# Patient Record
Sex: Male | Born: 1995 | Race: White | Hispanic: No | Marital: Single | Smoking: Never smoker
Health system: Southern US, Community
[De-identification: ages and names within clinical notes are randomized; demographics above are authoritative.]

## PROBLEM LIST (undated history)

## (undated) HISTORY — PX: WISDOM TOOTH EXTRACTION: SHX21

---
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
## 2014-08-07 NOTE — H&P (Signed)
PATIENT NAME:  Richard Young, LENGACHER MR#:  [id number] DATE OF BIRTH:  [birth year]  DATE OF ADMISSION:  08/14/2011  PRIMARY CARE PHYSICIAN: Dr. Tammy Sours, [facility]  ADMITTING PHYSICIAN: Dr. Michela Pitcher   CHIEF COMPLAINT: Fever, seizure and abdominal pain.   BRIEF HISTORY: Richard Young is a 19 year old boy seen in the Emergency Room with a two day history of fever with recent development of abdominal pain. 48 hours ago he was generally unwell and was noted to have a temperature over 102. His mother confirmed the temperature. He was able to sleep over the course of that evening but the following morning upon awaking was noted again to have a temperature of almost 101. While voiding he suffered a witnessed seizure and was transported urgently to the Emergency Room for further evaluation. Work-up at that time revealed a white blood cell count of 13,500 with reasonable blood chemistries and no evidence of any dehydration. EKG and chest x-ray were performed both of which did not demonstrate any significant abnormality. He returned home yesterday afternoon continuing to be febrile. He slept poorly over the course of the evening and then woke up early this morning and began to develop some abdominal pain. Bilateral lower quadrant abdominal pain accompanied by seven bouts of diarrhea. He had liquid stools which he felt that he really could not control. He did not have any nausea or vomiting, was able to eat normally through lunch today but with the persistent fever, abdominal pain and diarrhea he presented to the Emergency Room once again for further evaluation. He was not febrile on his presentation to the Emergency Room. His electrolytes were still largely unremarkable, although CO2 was slightly elevated at 28. White blood cell count was now below 7000. He was afebrile on presentation with otherwise reasonable vital signs. A noncontrasted CT scan was performed. No significant abdominal abnormality could be  identified. Specifically, the appendix could not be visualized. Head CT scan was also performed for his seizure disorder which was unremarkable.   In view of his persistent abdominal pain, the surgical service was consulted for possible acute appendicitis.   He has no other significant GI history. No history of hepatitis, yellow jaundice, pancreatitis, peptic ulcer disease or abdominal surgery. He has never had any hospitalizations. He has no cardiac disease, hypertension, or diabetes. He is followed regularly by the [facility] group and Dr. Tammy Sours is his primary pediatrician.   MEDICATIONS: He takes no medications regularly.   ALLERGIES: No medical allergies.   FAMILY HISTORY: He does have family history of seizure disorder in his father which is well controlled. His mother has significant exposure to seizure disorder.  REVIEW OF SYSTEMS: Performed and is otherwise unremarkable. His diarrhea has improved.   PHYSICAL EXAMINATION:  GENERAL: He is an alert, pleasant, comfortable gentleman in no significant distress.   VITALS: His vital signs are stable and he is not febrile. Blood pressure 113/68, heart rate 102 and regular.   HEENT: Unremarkable. He is nonicteric and he has equally round pupils. He has no facial deformities and he is a bit flushed.   NECK: Supple without adenopathy and his trachea is midline.   CHEST: Clear with no adventitious sounds. He has normal pulmonary excursion.   CARDIAC: No murmurs or gallops to my ear. He seems to be in normal sinus rhythm.   ABDOMEN: His abdomen is soft with generalized lower quadrant tenderness and some point tenderness in right lower quadrant. He has no guarding and no significant rebound. He  has active bowel sounds. No masses. No hernias noted.   EXTREMITIES: He has normal lower extremity examination with full range of motion and good distal pulses.   PSYCHIATRIC/NEUROLOGIC: Unremarkable as is his current neurologic exam  with normal orientation and affect and full range of motion with no neurologic defects noted.   LABORATORY, DIAGNOSTIC AND RADIOLOGICAL DATA: I have independently reviewed his CT scan. I do not identify any inflammatory changes in the right lower quadrant. There is an air filled structure which could be the appendix but I agree with the radiologist very difficult to know for sure and the absence of p.o. contrast limits the ability to separate the loops of bowel. An unopacified bowel makes very difficult to identify any structures definitively in the right lower quadrant.   ASSESSMENT AND PLAN: His clinical presentation and clinical examination do not support acute appendicitis. Abdominal pain has been last part of his presentation and he has no other GI symptoms except diarrhea. This presentation sounds to me like viral infection although we certainly cannot be certain that he does not have some underlying appendiceal problem. Will plan to admit him to the hospital since this is his second presentation in 24 hours. Will arrange for pediatric consultation to be certain there is no further work-up required with his seizure disorder. We will not start him on antibiotics at the present time. Will give him pain medicine, rehydration and antiemetics and see how he does over the next 12 to 18 hours. Should appendicitis still be a potential diagnostic issue we can repeat a CT scan with contrast or consider laparoscopy to review the right lower quadrant anatomy. However, clinically at this time I do not think he has appendicitis and I see no urgent surgical indications. I discussed this plan with the patient and his mother and they are in agreement.   ____________________________ Carmie Endalph L. Ely III, MD rle:cms D: 08/14/2011 00:25:20 ET T: 08/14/2011 08:42:21 ET JOB#: [id number]  cc: Quentin Orealph L. Ely III, MD, <Dictator> Scott A. Fredric MareBailey, MD Quentin OreALPH L ELY MD ELECTRONICALLY SIGNED 08/15/2011 19:30

## 2017-02-17 ENCOUNTER — Other Ambulatory Visit: Payer: Self-pay

## 2017-02-17 ENCOUNTER — Encounter: Payer: Self-pay | Admitting: Emergency Medicine

## 2017-02-17 ENCOUNTER — Emergency Department: Payer: Self-pay

## 2017-02-17 ENCOUNTER — Emergency Department
Admission: EM | Admit: 2017-02-17 | Discharge: 2017-02-18 | Disposition: A | Discharge status: Home / Self Care | Payer: Self-pay | Attending: Emergency Medicine | Admitting: Emergency Medicine

## 2017-02-17 DIAGNOSIS — F419 Anxiety disorder, unspecified: Secondary | ICD-10-CM

## 2017-02-17 DIAGNOSIS — R0602 Shortness of breath: Secondary | ICD-10-CM

## 2017-02-17 LAB — CBC
HCT: 43.6 % (ref 40.0–52.0)
HEMOGLOBIN: 15.3 g/dL (ref 13.0–18.0)
MCH: 30.6 pg (ref 26.0–34.0)
MCHC: 35.1 g/dL (ref 32.0–36.0)
MCV: 87.2 fL (ref 80.0–100.0)
PLATELETS: 231 10*3/uL (ref 150–440)
RBC: 5 MIL/uL (ref 4.40–5.90)
RDW: 12 % (ref 11.5–14.5)
WBC: 7.6 10*3/uL (ref 3.8–10.6)

## 2017-02-17 LAB — BASIC METABOLIC PANEL
ANION GAP: 7 (ref 5–15)
BUN: 16 mg/dL (ref 6–20)
CALCIUM: 9.3 mg/dL (ref 8.9–10.3)
CO2: 30 mmol/L (ref 22–32)
CREATININE: 1.01 mg/dL (ref 0.61–1.24)
Chloride: 101 mmol/L (ref 101–111)
Glucose, Bld: 87 mg/dL (ref 65–99)
Potassium: 3.9 mmol/L (ref 3.5–5.1)
SODIUM: 138 mmol/L (ref 135–145)

## 2017-02-17 LAB — TROPONIN I: Troponin I: <0.03 ng/mL (ref <0.03)

## 2017-02-17 NOTE — ED Triage Notes (Signed)
Patient to ER for c/o shortness of breath. States for approx last 6 months he had had episodes of shortness of breath, but today he had 17 episodes of shortness of breath. States when episodes occur, it feels like his breath is being taken away. Denies tachypnea at times of episodes. Denies any medical history.

## 2017-02-18 LAB — FIBRIN DERIVATIVES D-DIMER (ARMC ONLY): Fibrin derivatives D-dimer (ARMC): 263.35 ng/mL (FEU) (ref 0.00–499.00)

## 2017-02-18 NOTE — ED Provider Notes (Signed)
[facility] Emergency Department Provider Note   First MD Initiated Contact with Patient 02/17/17 2349     (approximate)  I have reviewed the triage vital signs and the nursing notes.   HISTORY  Chief Complaint Shortness of Breath   HPI Richard Young is a 21 y.o. male presents with 3140-month history of intermittent shortness of breath.  Patient states episodes are quick lasting less than a second.  Of note patient states that he has had 20 episodes today.  Patient denies any precipitating factors.  Patient states that episodes occur both at rest and with exertion.  Patient denies any lower extremity pain or swelling.  Patient denies any chest pain.  Patient denies any dizziness.  Patient does admit to a familial history of pulmonary emboli in his father and a DVT in his grandfather.   Past medical history Anxiety Pression There are no active problems to display for this patient.   Past Surgical History:  Procedure Laterality Date  . WISDOM TOOTH EXTRACTION      Prior to Admission medications   Not on File    Allergies No known drug allergies No family history on file.  Social History Social History   Tobacco Use  . Smoking status: Never Smoker  . Smokeless tobacco: Never Used  Substance Use Topics  . Alcohol use: No    Frequency: Never  . Drug use: No    Review of Systems Constitutional: No fever/chills Eyes: No visual changes. ENT: No sore throat. Cardiovascular: Denies chest pain. Respiratory: Denies shortness of breath. Gastrointestinal: No abdominal pain.  No nausea, no vomiting.  No diarrhea.  No constipation. Genitourinary: Negative for dysuria. Musculoskeletal: Negative for neck pain.  Negative for back pain. Integumentary: Negative for rash. Neurological: Negative for headaches, focal weakness or numbness. Psychiatric: Positive for anxiety   ____________________________________________   PHYSICAL EXAM:  VITAL  SIGNS: ED Triage Vitals  Enc Vitals Group     BP 02/17/17 2125 (!) 143/75     Pulse Rate 02/17/17 2125 71     Resp 02/17/17 2125 18     Temp 02/17/17 2125 97.8 F (36.6 C)     Temp Source 02/17/17 2125 Oral     SpO2 02/17/17 2125 100 %     Weight 02/17/17 2121 73.5 kg (162 lb)     Height 02/17/17 2121 1.727 m (5\' 8" )     Head Circumference --      Peak Flow --      Pain Score --      Pain Loc --      Pain Edu? --      Excl. in GC? --     Constitutional: Alert and oriented. Well appearing and in no acute distress. Eyes: Conjunctivae are normal.  Head: Atraumatic. Mouth/Throat: Mucous membranes are moist.  Oropharynx non-erythematous. Neck: No stridor.   Cardiovascular: Normal rate, regular rhythm. Good peripheral circulation. Grossly normal heart sounds. Respiratory: Normal respiratory effort.  No retractions. Lungs CTAB. Gastrointestinal: Soft and nontender. No distention.  Musculoskeletal: No lower extremity tenderness nor edema. No gross deformities of extremities. Neurologic:  Normal speech and language. No gross focal neurologic deficits are appreciated.  Skin:  Skin is warm, dry and intact. No rash noted. Psychiatric: Anxious affect. Speech and behavior are normal.  ____________________________________________   LABS (all labs ordered are listed, but only abnormal results are displayed)  Labs Reviewed  BASIC METABOLIC PANEL  CBC  TROPONIN I  FIBRIN DERIVATIVES D-DIMER ([facility] ONLY)  ____________________________________________  EKG  Time: 9:23 PM Rate: 66 Rhythm: Normal sinus rhythm Intervals: Normal QT 384 QTC 402 Axis: Normal ST segment: No ST segment elevation or depression. ____________________________________________  RADIOLOGY I, Darci CurrentANDOLPH N Bowyn Mercier, personally viewed and evaluated these images (plain radiographs) as part of my medical decision making, as well as reviewing the written report by the radiologist.  Dg Chest 2 View  Result Date:  02/17/2017 CLINICAL DATA:  Shortness of breath beginning about 6 months ago. Worse today. EXAM: CHEST  2 VIEW COMPARISON:  08/12/2011 FINDINGS: The heart size and mediastinal contours are within normal limits. Both lungs are clear. The visualized skeletal structures are unremarkable. IMPRESSION: No active cardiopulmonary disease. Electronically Signed   By: Burman NievesWilliam  Stevens M.D.   On: 02/17/2017 22:02     Procedures   ____________________________________________   INITIAL IMPRESSION / ASSESSMENT AND PLAN / ED COURSE  As part of my medical decision making, I reviewed the following data within the electronic MEDICAL RECORD [age] year old male presented with above-stated history of dyspnea intermittently lasting approximately 1 second each episode.  Consider the possibility of cardiac etiology however EKG revealed with the right and no expiration for the patient's dyspnea.  Laboratory data normal including d-dimer.  Patient does admits to history of anxiety for which she was prescribed medications in the past which he discontinued on his own.  Suspect that could be the potential etiology of the patient's intermittent dyspnea.  Patient be referred to primary care provider for further outpatient evaluation and management.    ____________________________________________  FINAL CLINICAL IMPRESSION(S) / ED DIAGNOSES  Final diagnoses:  Shortness of breath  Anxiety     MEDICATIONS GIVEN DURING THIS VISIT:  Medications - No data to display     Note:  This document was prepared using Dragon voice recognition software and may include unintentional dictation errors.    Darci CurrentBrown, [place] N, MD 02/18/17 [id number]

## 2018-02-09 DIAGNOSIS — N50819 Testicular pain, unspecified: Secondary | ICD-10-CM | POA: Diagnosis not present

## 2018-02-09 DIAGNOSIS — R1032 Left lower quadrant pain: Secondary | ICD-10-CM | POA: Diagnosis not present

## 2018-02-10 DIAGNOSIS — N50819 Testicular pain, unspecified: Secondary | ICD-10-CM | POA: Diagnosis not present

## 2018-02-10 DIAGNOSIS — N433 Hydrocele, unspecified: Secondary | ICD-10-CM | POA: Diagnosis not present

## 2018-02-10 DIAGNOSIS — R1032 Left lower quadrant pain: Secondary | ICD-10-CM | POA: Diagnosis not present

## 2018-02-10 DIAGNOSIS — I861 Scrotal varices: Secondary | ICD-10-CM | POA: Diagnosis not present

## 2018-02-10 DIAGNOSIS — N50812 Left testicular pain: Secondary | ICD-10-CM | POA: Diagnosis not present

## 2018-02-24 DIAGNOSIS — M542 Cervicalgia: Secondary | ICD-10-CM | POA: Diagnosis not present

## 2018-02-24 DIAGNOSIS — M7918 Myalgia, other site: Secondary | ICD-10-CM | POA: Diagnosis not present

## 2018-02-24 DIAGNOSIS — M4306 Spondylolysis, lumbar region: Secondary | ICD-10-CM | POA: Diagnosis not present

## 2018-02-24 DIAGNOSIS — M545 Low back pain: Secondary | ICD-10-CM | POA: Diagnosis not present

## 2018-07-09 DIAGNOSIS — L03213 Periorbital cellulitis: Secondary | ICD-10-CM | POA: Diagnosis not present

## 2018-07-09 DIAGNOSIS — H109 Unspecified conjunctivitis: Secondary | ICD-10-CM | POA: Diagnosis not present

## 2018-10-27 DIAGNOSIS — Z20828 Contact with and (suspected) exposure to other viral communicable diseases: Secondary | ICD-10-CM | POA: Diagnosis not present

## 2018-10-27 DIAGNOSIS — U071 COVID-19: Secondary | ICD-10-CM | POA: Diagnosis not present

## 2018-12-17 DIAGNOSIS — Z23 Encounter for immunization: Secondary | ICD-10-CM | POA: Diagnosis not present

## 2018-12-17 DIAGNOSIS — M7918 Myalgia, other site: Secondary | ICD-10-CM | POA: Diagnosis not present

## 2018-12-17 DIAGNOSIS — R350 Frequency of micturition: Secondary | ICD-10-CM | POA: Diagnosis not present

## 2018-12-17 DIAGNOSIS — Z0000 Encounter for general adult medical examination without abnormal findings: Secondary | ICD-10-CM | POA: Diagnosis not present

## 2018-12-17 DIAGNOSIS — R635 Abnormal weight gain: Secondary | ICD-10-CM | POA: Diagnosis not present

## 2018-12-17 DIAGNOSIS — M5442 Lumbago with sciatica, left side: Secondary | ICD-10-CM | POA: Diagnosis not present

## 2019-01-18 DIAGNOSIS — M545 Low back pain: Secondary | ICD-10-CM | POA: Diagnosis not present

## 2019-01-18 DIAGNOSIS — M4317 Spondylolisthesis, lumbosacral region: Secondary | ICD-10-CM | POA: Diagnosis not present

## 2019-01-18 DIAGNOSIS — M542 Cervicalgia: Secondary | ICD-10-CM | POA: Diagnosis not present

## 2019-01-18 DIAGNOSIS — M4306 Spondylolysis, lumbar region: Secondary | ICD-10-CM | POA: Diagnosis not present

## 2019-01-18 DIAGNOSIS — M533 Sacrococcygeal disorders, not elsewhere classified: Secondary | ICD-10-CM | POA: Diagnosis not present

## 2019-01-18 DIAGNOSIS — G8929 Other chronic pain: Secondary | ICD-10-CM | POA: Diagnosis not present

## 2019-02-01 DIAGNOSIS — M542 Cervicalgia: Secondary | ICD-10-CM | POA: Diagnosis not present

## 2019-02-01 DIAGNOSIS — G8929 Other chronic pain: Secondary | ICD-10-CM | POA: Diagnosis not present

## 2019-02-01 DIAGNOSIS — M549 Dorsalgia, unspecified: Secondary | ICD-10-CM | POA: Diagnosis not present

## 2019-02-01 DIAGNOSIS — M545 Low back pain: Secondary | ICD-10-CM | POA: Diagnosis not present

## 2019-02-01 DIAGNOSIS — M7918 Myalgia, other site: Secondary | ICD-10-CM | POA: Diagnosis not present

## 2019-02-02 DIAGNOSIS — M542 Cervicalgia: Secondary | ICD-10-CM | POA: Diagnosis not present

## 2019-02-02 DIAGNOSIS — M545 Low back pain: Secondary | ICD-10-CM | POA: Diagnosis not present

## 2019-02-02 DIAGNOSIS — M549 Dorsalgia, unspecified: Secondary | ICD-10-CM | POA: Diagnosis not present

## 2019-02-02 DIAGNOSIS — G8929 Other chronic pain: Secondary | ICD-10-CM | POA: Diagnosis not present

## 2019-02-12 DIAGNOSIS — M542 Cervicalgia: Secondary | ICD-10-CM | POA: Diagnosis not present

## 2019-02-12 DIAGNOSIS — G8929 Other chronic pain: Secondary | ICD-10-CM | POA: Diagnosis not present

## 2019-02-12 DIAGNOSIS — M545 Low back pain: Secondary | ICD-10-CM | POA: Diagnosis not present

## 2019-02-12 DIAGNOSIS — M549 Dorsalgia, unspecified: Secondary | ICD-10-CM | POA: Diagnosis not present

## 2019-02-26 DIAGNOSIS — M545 Low back pain: Secondary | ICD-10-CM | POA: Diagnosis not present

## 2019-02-26 DIAGNOSIS — M542 Cervicalgia: Secondary | ICD-10-CM | POA: Diagnosis not present

## 2019-02-26 DIAGNOSIS — M549 Dorsalgia, unspecified: Secondary | ICD-10-CM | POA: Diagnosis not present

## 2019-02-26 DIAGNOSIS — G8929 Other chronic pain: Secondary | ICD-10-CM | POA: Diagnosis not present

## 2019-03-05 DIAGNOSIS — M7989 Other specified soft tissue disorders: Secondary | ICD-10-CM | POA: Diagnosis not present

## 2019-03-26 DIAGNOSIS — M542 Cervicalgia: Secondary | ICD-10-CM | POA: Diagnosis not present

## 2019-03-26 DIAGNOSIS — M549 Dorsalgia, unspecified: Secondary | ICD-10-CM | POA: Diagnosis not present

## 2019-03-26 DIAGNOSIS — G8929 Other chronic pain: Secondary | ICD-10-CM | POA: Diagnosis not present

## 2019-03-26 DIAGNOSIS — M545 Low back pain: Secondary | ICD-10-CM | POA: Diagnosis not present

## 2019-03-30 DIAGNOSIS — M7918 Myalgia, other site: Secondary | ICD-10-CM | POA: Diagnosis not present

## 2019-03-30 DIAGNOSIS — E559 Vitamin D deficiency, unspecified: Secondary | ICD-10-CM | POA: Diagnosis not present

## 2019-03-30 DIAGNOSIS — M545 Low back pain: Secondary | ICD-10-CM | POA: Diagnosis not present

## 2019-03-30 DIAGNOSIS — E7800 Pure hypercholesterolemia, unspecified: Secondary | ICD-10-CM | POA: Diagnosis not present

## 2019-04-02 DIAGNOSIS — E559 Vitamin D deficiency, unspecified: Secondary | ICD-10-CM | POA: Diagnosis not present

## 2019-04-02 DIAGNOSIS — E7800 Pure hypercholesterolemia, unspecified: Secondary | ICD-10-CM | POA: Diagnosis not present

## 2019-04-02 DIAGNOSIS — M7918 Myalgia, other site: Secondary | ICD-10-CM | POA: Diagnosis not present

## 2019-04-15 DIAGNOSIS — M4316 Spondylolisthesis, lumbar region: Secondary | ICD-10-CM | POA: Diagnosis not present

## 2019-04-15 DIAGNOSIS — M545 Low back pain: Secondary | ICD-10-CM | POA: Diagnosis not present

## 2019-04-15 DIAGNOSIS — M4314 Spondylolisthesis, thoracic region: Secondary | ICD-10-CM | POA: Diagnosis not present

## 2019-04-15 DIAGNOSIS — M4310 Spondylolisthesis, site unspecified: Secondary | ICD-10-CM | POA: Diagnosis not present

## 2019-04-15 DIAGNOSIS — M546 Pain in thoracic spine: Secondary | ICD-10-CM | POA: Diagnosis not present

## 2019-04-15 DIAGNOSIS — G8929 Other chronic pain: Secondary | ICD-10-CM | POA: Diagnosis not present

## 2019-04-21 DIAGNOSIS — M549 Dorsalgia, unspecified: Secondary | ICD-10-CM | POA: Diagnosis not present

## 2019-04-21 DIAGNOSIS — M7918 Myalgia, other site: Secondary | ICD-10-CM | POA: Diagnosis not present

## 2019-07-15 ENCOUNTER — Emergency Department
Admission: EM | Admit: 2019-07-15 | Discharge: 2019-07-15 | Disposition: A | Discharge status: Home / Self Care | Payer: 59 | Attending: Emergency Medicine | Admitting: Emergency Medicine

## 2019-07-15 ENCOUNTER — Emergency Department: Payer: 59

## 2019-07-15 ENCOUNTER — Other Ambulatory Visit: Payer: Self-pay

## 2019-07-15 DIAGNOSIS — R1013 Epigastric pain: Secondary | ICD-10-CM | POA: Insufficient documentation

## 2019-07-15 DIAGNOSIS — R110 Nausea: Secondary | ICD-10-CM | POA: Diagnosis not present

## 2019-07-15 DIAGNOSIS — R109 Unspecified abdominal pain: Secondary | ICD-10-CM

## 2019-07-15 LAB — URINALYSIS, COMPLETE (UACMP) WITH MICROSCOPIC
Bacteria, UA: NONE SEEN
Bilirubin Urine: NEGATIVE
Glucose, UA: NEGATIVE mg/dL
Hgb urine dipstick: NEGATIVE
Ketones, ur: NEGATIVE mg/dL
Leukocytes,Ua: NEGATIVE
Nitrite: NEGATIVE
Protein, ur: NEGATIVE mg/dL
Specific Gravity, Urine: 1.012 (ref 1.005–1.030)
Squamous Epithelial / LPF: NONE SEEN (ref 0–5)
pH: 7 (ref 5.0–8.0)

## 2019-07-15 LAB — COMPREHENSIVE METABOLIC PANEL
ALT: 40 U/L (ref 0–44)
AST: 28 U/L (ref 15–41)
Albumin: 4.8 g/dL (ref 3.5–5.0)
Alkaline Phosphatase: 54 U/L (ref 38–126)
Anion gap: 10 (ref 5–15)
BUN: 11 mg/dL (ref 6–20)
CO2: 30 mmol/L (ref 22–32)
Calcium: 10 mg/dL (ref 8.9–10.3)
Chloride: 98 mmol/L (ref 98–111)
Creatinine, Ser: 1.01 mg/dL (ref 0.61–1.24)
GFR calc Af Amer: >60 mL/min (ref >60)
GFR calc non Af Amer: >60 mL/min (ref >60)
Glucose, Bld: 96 mg/dL (ref 70–99)
Potassium: 4 mmol/L (ref 3.5–5.1)
Sodium: 138 mmol/L (ref 135–145)
Total Bilirubin: 0.9 mg/dL (ref 0.3–1.2)
Total Protein: 8.2 g/dL — ABNORMAL HIGH (ref 6.5–8.1)

## 2019-07-15 LAB — CBC
HCT: 46.8 % (ref 39.0–52.0)
Hemoglobin: 16.3 g/dL (ref 13.0–17.0)
MCH: 30.6 pg (ref 26.0–34.0)
MCHC: 34.8 g/dL (ref 30.0–36.0)
MCV: 87.8 fL (ref 80.0–100.0)
Platelets: 193 10*3/uL (ref 150–400)
RBC: 5.33 MIL/uL (ref 4.22–5.81)
RDW: 11 % — ABNORMAL LOW (ref 11.5–15.5)
WBC: 6.6 10*3/uL (ref 4.0–10.5)
nRBC: 0 % (ref 0.0–0.2)

## 2019-07-15 LAB — LIPASE, BLOOD: Lipase: 25 U/L (ref 11–51)

## 2019-07-15 MED ORDER — IOHEXOL 9 MG/ML PO SOLN
1000.0000 mL | Freq: Once | ORAL | Status: AC | PRN
  Administered 2019-07-15: 15:00:00 1000 mL via ORAL

## 2019-07-15 MED ORDER — IOHEXOL 300 MG/ML  SOLN
100.0000 mL | Freq: Once | INTRAMUSCULAR | Status: AC | PRN
  Administered 2019-07-15: 16:00:00 100 mL via INTRAVENOUS

## 2019-07-15 MED ORDER — MORPHINE SULFATE (PF) 4 MG/ML IV SOLN
4.0000 mg | INTRAVENOUS | Status: DC | PRN
  Filled 2019-07-15: qty 1

## 2019-07-15 MED ORDER — SODIUM CHLORIDE 0.9 % IV BOLUS
500.0000 mL | Freq: Once | INTRAVENOUS | Status: AC
  Administered 2019-07-15: 15:00:00 500 mL via INTRAVENOUS

## 2019-07-15 MED ORDER — ONDANSETRON HCL 4 MG/2ML IJ SOLN
4.0000 mg | Freq: Once | INTRAMUSCULAR | Status: AC
  Administered 2019-07-15: 15:00:00 4 mg via INTRAVENOUS
  Filled 2019-07-15: qty 2

## 2019-07-15 MED ORDER — SODIUM CHLORIDE 0.9% FLUSH: 3.0000 mL | Freq: Once | INTRAVENOUS | Status: DC

## 2019-07-15 MED ORDER — ONDANSETRON 4 MG PO TBDP: 4.0000 mg | ORAL_TABLET | Freq: Four times a day (QID) | ORAL | 0 refills | Status: AC | PRN

## 2019-07-15 MED ORDER — DICYCLOMINE HCL 10 MG PO CAPS: 10.0000 mg | ORAL_CAPSULE | Freq: Once | ORAL | Status: DC

## 2019-07-15 MED ORDER — DICYCLOMINE HCL 10 MG PO CAPS: 10.0000 mg | ORAL_CAPSULE | Freq: Three times a day (TID) | ORAL | 0 refills | Status: AC | PRN

## 2019-07-15 NOTE — ED Triage Notes (Signed)
Pt started with abd pain that radiates to mid sternal chest at 730am - reports nausea but denies vomiting - reports pain with deep inspiration - reports this does not feel like heartburn/indigestion

## 2019-07-15 NOTE — ED Provider Notes (Signed)
[facility] Emergency Department Provider Note   ____________________________________________   First MD Initiated Contact with Patient 07/15/19 1459     (approximate)  I have reviewed the triage vital signs and the nursing notes.   HISTORY  Chief Complaint Abdominal Pain    HPI Richard Young is a 24 y.o. male reports no previous medical history high cholesterol  Today at 730 while seated at his desk at work he began experiencing pain in his abdomen.  Reports it started as a sharp pain sort of in the middle of his upper abdomen.  Throughout the day it has been associated with feeling like he felt a little bloated, then persistent crampy feeling in his mid upper abdomen.  At times when he takes a deep breath it will radiate slightly towards his upper chest, but reports the pain is really centered in his mid abdomen at this point.  No fevers or chills.  Some nausea no vomiting.  Reports right now the pain is mild to moderate, but does not wish for any pain medicine at this moment.  No difficulty with urination.  No recent illnesses     History reviewed. No pertinent past medical history.  There are no problems to display for this patient.   Past Surgical History:  Procedure Laterality Date  . WISDOM TOOTH EXTRACTION      Prior to Admission medications   Medication Sig Start Date End Date Taking? Authorizing Provider  dicyclomine (BENTYL) 10 MG capsule Take 1 capsule (10 mg total) by mouth 3 (three) times daily as needed for up to 5 days for spasms. 07/15/19 07/20/19  Sharyn Creamer, MD  ondansetron (ZOFRAN ODT) 4 MG disintegrating tablet Take 1 tablet (4 mg total) by mouth every 6 (six) hours as needed for nausea or vomiting. 07/15/19   Sharyn Creamer, MD    Allergies Patient has no known allergies.  No family history on file.  Social History Social History   Tobacco Use  . Smoking status: Never Smoker  . Smokeless tobacco: Never Used  Substance  Use Topics  . Alcohol use: No  . Drug use: No    Review of Systems Constitutional: No fever/chills Eyes: No visual changes. ENT: No sore throat. Cardiovascular: Denies chest pain except some of the abdominal pains milligrams radial torches chest. Respiratory: Denies shortness of breath. Gastrointestinal: See HPI Genitourinary: Negative for dysuria. Musculoskeletal: Negative for back pain. Skin: Negative for rash. Neurological: Negative for headaches, areas of focal weakness or numbness.    ____________________________________________   PHYSICAL EXAM:  VITAL SIGNS: ED Triage Vitals  Enc Vitals Group     BP 07/15/19 1136 129/71     Pulse Rate 07/15/19 1136 68     Resp --      Temp 07/15/19 1136 98.6 F (37 C)     Temp Source 07/15/19 1136 Oral     SpO2 07/15/19 1136 100 %     Weight 07/15/19 1140 170 lb (77.1 kg)     Height 07/15/19 1140 5\' 8"  (1.727 m)     Head Circumference --      Peak Flow --      Pain Score 07/15/19 1140 8     Pain Loc --      Pain Edu? --      Excl. in GC? --     Constitutional: Alert and oriented. Well appearing and in no acute distress. Eyes: Conjunctivae are normal. Head: Atraumatic. Nose: No congestion/rhinnorhea. Mouth/Throat: Mucous membranes are  moist. Neck: No stridor.  Cardiovascular: Normal rate, regular rhythm. Grossly normal heart sounds.  Good peripheral circulation. Respiratory: Normal respiratory effort.  No retractions. Lungs CTAB. Gastrointestinal: Mild tenderness to palpation across the epigastrium, right upper quadrant and right lower quadrant.  There is no focal one spot of severity but he does certainly demonstrate tenderness periumbilically and right greater than left.  Seems to lack any tenderness in the left lower quadrant. Musculoskeletal: No lower extremity tenderness nor edema. Neurologic:  Normal speech and language. No gross focal neurologic deficits are appreciated.  Skin:  Skin is warm, dry and intact. No rash  noted. Psychiatric: Mood and affect are normal. Speech and behavior are normal.  ____________________________________________   LABS (all labs ordered are listed, but only abnormal results are displayed)  Labs Reviewed  COMPREHENSIVE METABOLIC PANEL - Abnormal; Notable for the following components:      Result Value   Total Protein 8.2 (*)    All other components within normal limits  CBC - Abnormal; Notable for the following components:   RDW 11.0 (*)    All other components within normal limits  URINALYSIS, COMPLETE (UACMP) WITH MICROSCOPIC - Abnormal; Notable for the following components:   Color, Urine YELLOW (*)    APPearance CLEAR (*)    All other components within normal limits  LIPASE, BLOOD   ____________________________________________  EKG  ED ECG REPORT I, Delman Kitten, the attending physician, personally viewed and interpreted this ECG.  Date: 07/15/2019 EKG Time: 1140 Rate: 60 Rhythm: normal sinus rhythm QRS Axis: normal Intervals: normal ST/T Wave abnormalities: normal Narrative Interpretation: no evidence of acute ischemia  ____________________________________________  RADIOLOGY  CT ABDOMEN PELVIS W CONTRAST  Result Date: 07/15/2019 CLINICAL DATA:  Abdominal pain radiating to mid sternal chest region since 0730 hours, nausea without vomiting, pain with deep inspiration EXAM: CT ABDOMEN AND PELVIS WITH CONTRAST TECHNIQUE: Multidetector CT imaging of the abdomen and pelvis was performed using the standard protocol following bolus administration of intravenous contrast. Sagittal and coronal MPR images reconstructed from axial data set. CONTRAST:  138mL OMNIPAQUE IOHEXOL 300 MG/ML SOLN IV. Dilute oral contrast. COMPARISON:  08/14/2011 FINDINGS: Lower chest: Lung bases clear Hepatobiliary: Gallbladder and liver normal appearance Pancreas: Normal appearance Spleen: Normal appearance Adrenals/Urinary Tract: Adrenal glands, kidneys, ureters, and bladder normal  appearance Stomach/Bowel: Normal appendix. Stomach well distended by contrast. Stomach and bowel loops normal appearance. Vascular/Lymphatic: Vascular structures patent. No adenopathy. Reproductive: Unremarkable prostate gland and seminal vesicles Other: No free air or free fluid. No hernia or inflammatory process. 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 IMPRESSION: No acute intra-abdominal or intrapelvic abnormalities. 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 Electronically Signed   By: Lavonia Dana M.D.   On: 07/15/2019 16:46     ____________________________________________   PROCEDURES  Procedure(s) performed: None  Procedures  Critical Care performed: No  ____________________________________________   INITIAL IMPRESSION / ASSESSMENT AND PLAN / ED COURSE  Pertinent labs & imaging results that were available during my care of the patient were reviewed by me and considered in my medical decision making (see chart for details).   Differential diagnosis includes but is not limited to, abdominal perforation, aortic dissection, cholecystitis, appendicitis, diverticulitis, colitis, esophagitis/gastritis, kidney stone, pyelonephritis, urinary tract infection, aortic aneurysm. All are considered in decision and treatment plan. Based upon the patient's presentation and risk factors, I suspect intra-abdominal etiology, given his right-sided tenderness starting with it as a sharp somewhat epigastric pain shortly would entertain possible biliary or appendiceal type pathologies.  Given  the sharp nature would consider kidney stones etc.  Based on clinical history will evaluate broadly with CT abdomen pelvis.  He is very nontoxic in appearance with reassuring abdominal and CBC lab work.  No cardiac or pulmonary symptoms.  Normal EKG.  6pm  Patient reports pain improved, mild at present. Reviewed CT scan and reassuring findings with him.  Patient comfortable  with discharge with careful return precautions.  Will prescribe Bentyl and Zofran in the event needed.  Patient comfortable with plan  Return precautions and treatment recommendations and follow-up discussed with the patient who is agreeable with the plan.    ____________________________________________   FINAL CLINICAL IMPRESSION(S) / ED DIAGNOSES  Final diagnoses:  Abdominal pain, unspecified abdominal location        Note:  This document was prepared using Dragon voice recognition software and may include unintentional dictation errors       Sharyn Creamer, MD 07/15/19 1757

## 2019-07-15 NOTE — Discharge Instructions (Signed)
? ?  Please return to the emergency room right away if you are to develop a fever, severe nausea, your pain becomes severe or worsens, you are unable to keep food down, begin vomiting any dark or bloody fluid, you develop any dark or bloody stools, feel dehydrated, or other new concerns or symptoms arise. ? ?

## 2022-08-07 ENCOUNTER — Other Ambulatory Visit: Payer: Self-pay | Admitting: Family Medicine

## 2022-08-07 DIAGNOSIS — G4484 Primary exertional headache: Secondary | ICD-10-CM

## 2022-08-07 DIAGNOSIS — Z808 Family history of malignant neoplasm of other organs or systems: Secondary | ICD-10-CM

## 2022-08-07 DIAGNOSIS — R519 Headache, unspecified: Secondary | ICD-10-CM

## 2022-08-10 ENCOUNTER — Ambulatory Visit
Admission: RE | Admit: 2022-08-10 | Discharge: 2022-08-10 | Disposition: A | Discharge status: Home / Self Care | Payer: Self-pay | Source: Ambulatory Visit | Attending: Family Medicine | Admitting: Family Medicine

## 2022-08-10 DIAGNOSIS — G4484 Primary exertional headache: Secondary | ICD-10-CM

## 2022-08-10 DIAGNOSIS — R519 Headache, unspecified: Secondary | ICD-10-CM | POA: Insufficient documentation

## 2022-08-10 DIAGNOSIS — Z808 Family history of malignant neoplasm of other organs or systems: Secondary | ICD-10-CM

## 2022-08-10 MED ORDER — GADOBUTROL 1 MMOL/ML IV SOLN
7.0000 mL | Freq: Once | INTRAVENOUS | Status: AC | PRN
  Administered 2022-08-10: 7.5 mL via INTRAVENOUS
